# Patient Record
Sex: Female | Born: 1975 | Race: Black or African American | Hispanic: No | Marital: Married | Smoking: Never smoker
Health system: Southern US, Community
[De-identification: ages and names within clinical notes are randomized; demographics above are authoritative.]

## PROBLEM LIST (undated history)

## (undated) DIAGNOSIS — I10 Essential (primary) hypertension: Secondary | ICD-10-CM

## (undated) HISTORY — PX: OOPHORECTOMY: SHX86

## (undated) HISTORY — PX: KNEE SURGERY: SHX244

---

## 2005-05-02 ENCOUNTER — Ambulatory Visit (HOSPITAL_COMMUNITY): Admission: RE | Admit: 2005-05-02 | Discharge: 2005-05-02 | Payer: Self-pay | Admitting: Gynecology

## 2005-10-08 IMAGING — RF DG HYSTEROGRAM
5 series · 5 of 5 positions shown · IV contrast (omnipaque)
Comparison: none

CLINICAL DATA: Secondary infertility.  The patient states she has had one tube and ovary removed for torsed ovary.   
HYSTEROGRAM:
Following sterile cleansing of the external cervical os with Betadine, hysterosalpingography was performed via placement of a 5 french hysterosalpingography catheter into the endocervical canal where it was stabilized with an end catheter balloon.   Approximately 10 cc of Omnipaque 300% was injected into the endometrial canal without difficulty.   A normal endometrial morphology is noted.  The right fallopian tube filled to the mid isthmic region where there was an abrupt termination and mild distention of the tube compatible with post surgical change.   The left fallopian tube did not demonstrate any filling.  For this reason, the catheter was advanced to the region of the left cornua and reinjection was undertaken.   Filling of the cornual tip itself is noted with some punctuate contrast collections identified directly following the filling of the cornua.   No tubal filling is seen beyond this and findings are compatible with occlusion proximally on this side.   The punctate collections suggest that there may be underlying salpingitis isthmica nodosa.   Alternatively this may represent filling of very small vascular channels with contrast due to the selective injection.

[Series 1: run · 1 of 1 slices shown (1 of 5)]
[im 1/1]
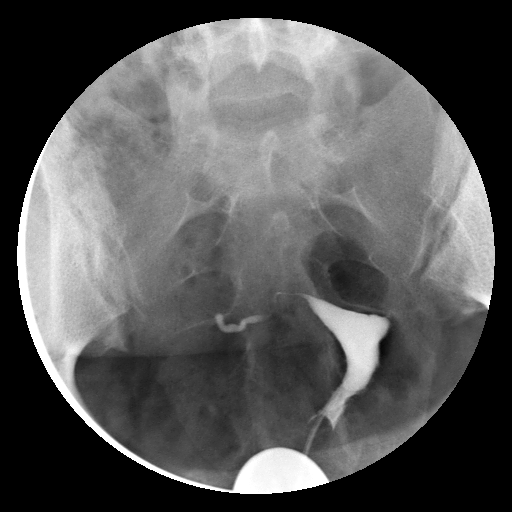

[Series 2: run · 1 of 1 slices shown (2 of 5)]
[im 1/1]
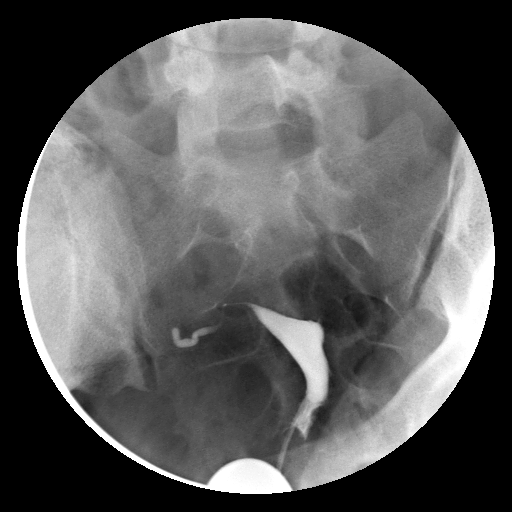

[Series 3: run · 1 of 1 slices shown (3 of 5)]
[im 1/1]
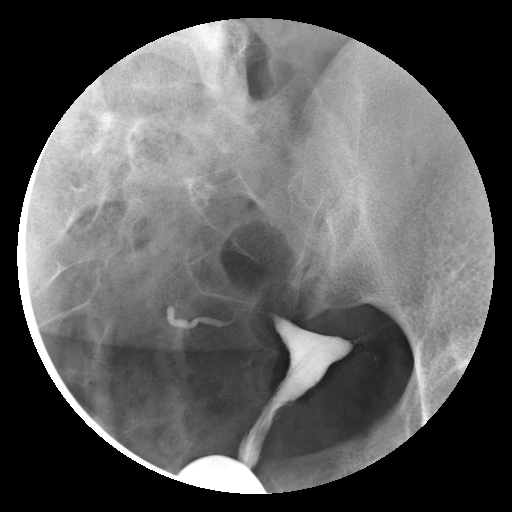

[Series 4: run · 1 of 1 slices shown (4 of 5)]
[im 1/1]
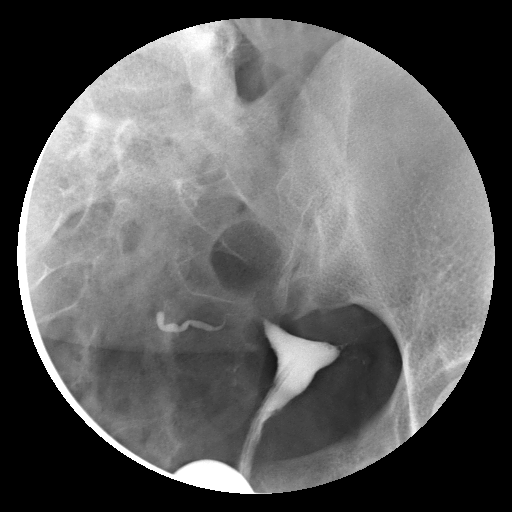

[Series 5: run · 1 of 1 slices shown (5 of 5)]
[im 1/1]
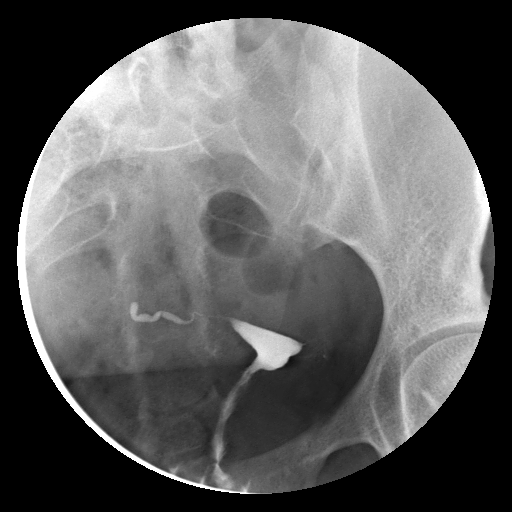

[5 of 5 positions shown; findings below may reference images not displayed]

IMPRESSION: 1.  Normal endometrial morphology.
2.  Post surgical occlusion of the mid isthmic portion of the right fallopian tube.
3.  Occlusion of the proximal isthmic portion of the left fallopian tube with questionable changes of salpingitis isthmica nodosa on this side.   No signs of any free intraperitoneal spill is noted.  Today?s scanned findings were discussed with the patient and her husband.

## 2007-01-16 ENCOUNTER — Emergency Department (HOSPITAL_COMMUNITY): Admission: EM | Admit: 2007-01-16 | Discharge: 2007-01-17 | Payer: Self-pay | Admitting: Emergency Medicine

## 2007-04-03 ENCOUNTER — Emergency Department (HOSPITAL_COMMUNITY): Admission: EM | Admit: 2007-04-03 | Discharge: 2007-04-03 | Payer: Self-pay | Admitting: Emergency Medicine

## 2012-02-16 ENCOUNTER — Emergency Department (HOSPITAL_COMMUNITY)
Admission: EM | Admit: 2012-02-16 | Discharge: 2012-02-16 | Disposition: A | Discharge status: Home / Self Care | Payer: No Typology Code available for payment source | Attending: Emergency Medicine | Admitting: Emergency Medicine

## 2012-02-16 ENCOUNTER — Encounter (HOSPITAL_COMMUNITY): Payer: Self-pay | Admitting: *Deleted

## 2012-02-16 DIAGNOSIS — M545 Low back pain, unspecified: Secondary | ICD-10-CM | POA: Insufficient documentation

## 2012-02-16 DIAGNOSIS — M542 Cervicalgia: Secondary | ICD-10-CM

## 2012-02-16 MED ORDER — IBUPROFEN 800 MG PO TABS: 800.0000 mg | ORAL_TABLET | Freq: Three times a day (TID) | ORAL | Status: AC

## 2012-02-16 MED ORDER — IBUPROFEN 800 MG PO TABS
800.0000 mg | ORAL_TABLET | Freq: Once | ORAL | Status: AC
  Administered 2012-02-16: 800 mg via ORAL
  Filled 2012-02-16: qty 1

## 2012-02-16 MED ORDER — METHOCARBAMOL 500 MG PO TABS: 500.0000 mg | ORAL_TABLET | Freq: Four times a day (QID) | ORAL | Status: AC | PRN
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
## 2012-02-16 NOTE — ED Notes (Signed)
Pt in hallway bed. Pt trying to take off neck braces and unstrap back board. Pt informed of risks of this action and will not follow directions to leave brace intact.
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
## 2012-02-17 NOTE — ED Provider Notes (Signed)
Medical screening examination/treatment/procedure(s) were performed by non-physician practitioner and as supervising physician I was immediately available for consultation/collaboration.   Suzi Roots, MD 02/17/12 [phone]

## 2012-11-17 ENCOUNTER — Encounter (HOSPITAL_COMMUNITY): Payer: Self-pay | Admitting: Emergency Medicine

## 2012-11-17 ENCOUNTER — Emergency Department (INDEPENDENT_AMBULATORY_CARE_PROVIDER_SITE_OTHER)
Admission: EM | Admit: 2012-11-17 | Discharge: 2012-11-17 | Disposition: A | Discharge status: Home / Self Care | Payer: 59 | Source: Home / Self Care

## 2012-11-17 DIAGNOSIS — J040 Acute laryngitis: Secondary | ICD-10-CM

## 2012-11-17 DIAGNOSIS — J029 Acute pharyngitis, unspecified: Secondary | ICD-10-CM

## 2012-11-17 DIAGNOSIS — J069 Acute upper respiratory infection, unspecified: Secondary | ICD-10-CM

## 2012-11-17 NOTE — ED Provider Notes (Signed)
History     CSN: [id number]  Arrival date & time 11/17/12  1004   None     Chief Complaint  Patient presents with  . Sore Throat    (Consider location/radiation/quality/duration/timing/severity/associated sxs/prior treatment) HPI Comments: 36 year old female presents with a sore throat for approximately 2 days. This is preceded by upper respiratory congestion with minor cough and PND. She feels as though much of her congestion is in her throat and uppermost chest. She denies having fever, earache or shortness of breath or chest pain. She noted that she saw a dentist last week for toothache and possible infection and was given penicillin. This is approximately 5 days ago she has been taking it but not 3 times every day.   History reviewed. No pertinent past medical history.  Past Surgical History  Procedure Date  . Knee surgery     No family history on file.  History  Substance Use Topics  . Smoking status: Never Smoker   . Smokeless tobacco: Not on file  . Alcohol Use: No    OB History    Grav Para Term Preterm Abortions TAB SAB Ect Mult Living                  Review of Systems  Constitutional: Negative for fever, chills, activity change, appetite change and fatigue.  HENT: Positive for congestion, sore throat, rhinorrhea and postnasal drip. Negative for facial swelling, neck pain and neck stiffness.   Eyes: Negative.   Respiratory: Negative.   Cardiovascular: Negative.   Gastrointestinal: Negative.   Skin: Negative for pallor and rash.  Neurological: Negative.     Allergies  Review of patient's allergies indicates no known allergies.  Home Medications   Current Outpatient Rx  Name  Route  Sig  Dispense  Refill  . HYDROCODONE-ACETAMINOPHEN 5-325 MG PO TABS   Oral   Take 1 tablet by mouth every 6 (six) hours as needed.         Marland Kitchen PENICILLIN V POTASSIUM PO   Oral   Take by mouth.           BP 152/98  Pulse 70  Temp 97.6 F (36.4 C) (Oral)   Resp 20  SpO2 99%  LMP 10/18/2012  Physical Exam  Constitutional: She is oriented to person, place, and time. She appears well-developed and well-nourished. No distress.  HENT:       Bilateral TMs are normal. Oropharynx with minor erythema or consistent with irritation and infection. There is clear PND. No exudates  Neck: Normal range of motion. Neck supple.  Cardiovascular: Normal rate and regular rhythm.   Pulmonary/Chest: Effort normal and breath sounds normal. No respiratory distress. She has no wheezes. She has no rales.  Musculoskeletal: Normal range of motion. She exhibits no edema.  Lymphadenopathy:    She has no cervical adenopathy.  Neurological: She is alert and oriented to person, place, and time.  Skin: Skin is warm and dry. No rash noted.  Psychiatric: She has a normal mood and affect.    ED Course  Procedures (including critical care time)  Labs Reviewed - No data to display No results found.   1. URI (upper respiratory infection)   2. Pharyngitis   3. Laryngitis       MDM  This is a stable-appearing female with primarily upper respiratory congestion and minor sore throat. Treat her with saltwater gargles. Recommend Cepacol lozenges when necessary sore throat discomfort Ibuprofen 600 mg every 6-8 hours when necessary throat  pain. Recommend either Claritin or Allegra 60 mg twice a day 180 mg daily for drainage as this is the primary source for the sore throat. Drink plenty of fluids and stay well hydrated, voice rest as much as possible Continue on her antibiotics prescribed by the dentist last week.         Hayden Rasmussen, NP 11/17/12 1114

## 2012-11-17 NOTE — ED Notes (Signed)
C/o sore throat and dizziness.  Reports being treated for tooth pain on Wednesday 12/11 at [facility].  Treated with pcn, continues to take penicillin.  Reports may have not been taking correctly or able to take as directed (not getting full dose in each day).  Sore throat started 2 days ago.  Reports chest congestion.  Patient is currently performing [place] [employer], hectic schedule.

## 2012-11-17 NOTE — ED Provider Notes (Signed)
Medical screening examination/treatment/procedure(s) were performed by non-physician practitioner and as supervising physician I was immediately available for consultation/collaboration.  Leslee Home, M.D.   Reuben Likes, MD 11/17/12 1351

## 2014-02-10 ENCOUNTER — Encounter (HOSPITAL_BASED_OUTPATIENT_CLINIC_OR_DEPARTMENT_OTHER): Payer: Self-pay | Admitting: Emergency Medicine

## 2014-02-10 ENCOUNTER — Emergency Department (HOSPITAL_BASED_OUTPATIENT_CLINIC_OR_DEPARTMENT_OTHER)
Admission: EM | Admit: 2014-02-10 | Discharge: 2014-02-10 | Disposition: A | Discharge status: Home / Self Care | Payer: Managed Care, Other (non HMO) | Attending: Emergency Medicine | Admitting: Emergency Medicine

## 2014-02-10 ENCOUNTER — Emergency Department (HOSPITAL_BASED_OUTPATIENT_CLINIC_OR_DEPARTMENT_OTHER): Payer: Managed Care, Other (non HMO)

## 2014-02-10 DIAGNOSIS — I10 Essential (primary) hypertension: Secondary | ICD-10-CM

## 2014-02-10 DIAGNOSIS — Z79899 Other long term (current) drug therapy: Secondary | ICD-10-CM | POA: Insufficient documentation

## 2014-02-10 DIAGNOSIS — R51 Headache: Secondary | ICD-10-CM | POA: Insufficient documentation

## 2014-02-10 DIAGNOSIS — K219 Gastro-esophageal reflux disease without esophagitis: Secondary | ICD-10-CM | POA: Insufficient documentation

## 2014-02-10 HISTORY — DX: Essential (primary) hypertension: I10

## 2014-02-10 LAB — COMPREHENSIVE METABOLIC PANEL
ALBUMIN: 3.5 g/dL (ref 3.5–5.2)
ALT: 14 U/L (ref 0–35)
AST: 25 U/L (ref 0–37)
Alkaline Phosphatase: 73 U/L (ref 39–117)
BUN: 12 mg/dL (ref 6–23)
CALCIUM: 8.9 mg/dL (ref 8.4–10.5)
CHLORIDE: 99 meq/L (ref 96–112)
CO2: 24 mEq/L (ref 19–32)
CREATININE: 0.67 mg/dL (ref 0.50–1.10)
GFR calc Af Amer: >90 mL/min (ref >90)
GFR calc non Af Amer: >90 mL/min (ref >90)
GLUCOSE: 100 mg/dL — AB (ref 70–99)
POTASSIUM: 4.1 meq/L (ref 3.7–5.3)
Sodium: 137 mEq/L (ref 137–147)
TOTAL PROTEIN: 7 g/dL (ref 6.0–8.3)
Total Bilirubin: 0.3 mg/dL (ref 0.3–1.2)

## 2014-02-10 LAB — I-STAT CHEM 8, ED
BUN: 11 mg/dL (ref 6–23)
CALCIUM ION: 1.25 mmol/L — AB (ref 1.12–1.23)
CHLORIDE: 99 meq/L (ref 96–112)
Creatinine, Ser: 0.8 mg/dL (ref 0.50–1.10)
Glucose, Bld: 101 mg/dL — ABNORMAL HIGH (ref 70–99)
HCT: 37 % (ref 36.0–46.0)
Hemoglobin: 12.6 g/dL (ref 12.0–15.0)
POTASSIUM: 3.7 meq/L (ref 3.7–5.3)
SODIUM: 139 meq/L (ref 137–147)
TCO2: 26 mmol/L (ref 0–100)

## 2014-02-10 LAB — CBC WITH DIFFERENTIAL/PLATELET
BASOS ABS: 0 10*3/uL (ref 0.0–0.1)
BASOS PCT: 0 % (ref 0–1)
EOS PCT: 1 % (ref 0–5)
Eosinophils Absolute: 0.1 10*3/uL (ref 0.0–0.7)
HCT: 33.9 % — ABNORMAL LOW (ref 36.0–46.0)
HEMOGLOBIN: 11.6 g/dL — AB (ref 12.0–15.0)
LYMPHS PCT: 33 % (ref 12–46)
Lymphs Abs: 2.2 10*3/uL (ref 0.7–4.0)
MCH: 29.7 pg (ref 26.0–34.0)
MCHC: 34.2 g/dL (ref 30.0–36.0)
MCV: 86.7 fL (ref 78.0–100.0)
Monocytes Absolute: 0.5 10*3/uL (ref 0.1–1.0)
Monocytes Relative: 8 % (ref 3–12)
Neutro Abs: 3.7 10*3/uL (ref 1.7–7.7)
Neutrophils Relative %: 58 % (ref 43–77)
PLATELETS: 260 10*3/uL (ref 150–400)
RBC: 3.91 MIL/uL (ref 3.87–5.11)
RDW: 12.5 % (ref 11.5–15.5)
WBC: 6.5 10*3/uL (ref 4.0–10.5)

## 2014-02-10 LAB — TROPONIN I

## 2014-02-10 LAB — LIPASE, BLOOD: Lipase: 11 U/L (ref 11–59)

## 2014-02-10 MED ORDER — RABEPRAZOLE SODIUM 20 MG PO TBEC: 20.0000 mg | DELAYED_RELEASE_TABLET | Freq: Every day | ORAL | Status: AC

## 2014-02-10 MED ORDER — ACETAMINOPHEN 325 MG PO TABS
650.0000 mg | ORAL_TABLET | Freq: Once | ORAL | Status: AC
  Administered 2014-02-10: 650 mg via ORAL
  Filled 2014-02-10: qty 2

## 2014-02-10 MED ORDER — GI COCKTAIL ~~LOC~~
30.0000 mL | Freq: Once | ORAL | Status: AC
Start: 2014-02-10 — End: 2014-02-10
  Administered 2014-02-10: 30 mL via ORAL
  Filled 2014-02-10: qty 30

## 2014-02-10 MED ORDER — HYDROCODONE-ACETAMINOPHEN 5-325 MG PO TABS: 1.0000 | ORAL_TABLET | ORAL | Status: AC | PRN

## 2014-02-10 MED ORDER — LISINOPRIL 5 MG PO TABS: 5.0000 mg | ORAL_TABLET | Freq: Every day | ORAL | Status: AC

## 2014-02-10 NOTE — ED Notes (Signed)
Pt reports she "felt bad" when she woke up at 0500- Reports she had headache, slight nausea and fatigue. Also reports "indigestion"

## 2014-02-10 NOTE — Discharge Instructions (Signed)
Gastroesophageal Reflux Disease, Adult  Gastroesophageal reflux disease (GERD) happens when acid from your stomach flows up into the esophagus. When acid comes in contact with the esophagus, the acid causes soreness (inflammation) in the esophagus. Over time, GERD may create small holes (ulcers) in the lining of the esophagus.  CAUSES   · Increased body weight. This puts pressure on the stomach, making acid rise from the stomach into the esophagus.  · Smoking. This increases acid production in the stomach.  · Drinking alcohol. This causes decreased pressure in the lower esophageal sphincter (valve or ring of muscle between the esophagus and stomach), allowing acid from the stomach into the esophagus.  · Late evening meals and a full stomach. This increases pressure and acid production in the stomach.  · A malformed lower esophageal sphincter.  Sometimes, no cause is found.  SYMPTOMS   · Burning pain in the lower part of the mid-chest behind the breastbone and in the mid-stomach area. This may occur twice a week or more often.  · Trouble swallowing.  · Sore throat.  · Dry cough.  · Asthma-like symptoms including chest tightness, shortness of breath, or wheezing.  DIAGNOSIS   Your caregiver may be able to diagnose GERD based on your symptoms. In some cases, X-rays and other tests may be done to check for complications or to check the condition of your stomach and esophagus.  TREATMENT   Your caregiver may recommend over-the-counter or prescription medicines to help decrease acid production. Ask your caregiver before starting or adding any new medicines.   HOME CARE INSTRUCTIONS   · Change the factors that you can control. Ask your caregiver for guidance concerning weight loss, quitting smoking, and alcohol consumption.  · Avoid foods and drinks that make your symptoms worse, such as:  · Caffeine or alcoholic drinks.  · Chocolate.  · Peppermint or mint flavorings.  · Garlic and onions.  · Spicy foods.  · Citrus fruits,  such as oranges, lemons, or limes.  · Tomato-based foods such as sauce, chili, salsa, and pizza.  · Fried and fatty foods.  · Avoid lying down for the 3 hours prior to your bedtime or prior to taking a nap.  · Eat small, frequent meals instead of large meals.  · Wear loose-fitting clothing. Do not wear anything tight around your waist that causes pressure on your stomach.  · Raise the head of your bed 6 to 8 inches with wood blocks to help you sleep. Extra pillows will not help.  · Only take over-the-counter or prescription medicines for pain, discomfort, or fever as directed by your caregiver.  · Do not take aspirin, ibuprofen, or other nonsteroidal anti-inflammatory drugs (NSAIDs).  SEEK IMMEDIATE MEDICAL CARE IF:   · You have pain in your arms, neck, jaw, teeth, or back.  · Your pain increases or changes in intensity or duration.  · You develop nausea, vomiting, or sweating (diaphoresis).  · You develop shortness of breath, or you faint.  · Your vomit is green, yellow, black, or looks like coffee grounds or blood.  · Your stool is red, bloody, or black.  These symptoms could be signs of other problems, such as heart disease, gastric bleeding, or esophageal bleeding.  MAKE SURE YOU:   · Understand these instructions.  · Will watch your condition.  · Will get help right away if you are not doing well or get worse.  Document Released: 08/29/2005 Document Revised: 02/11/2012 Document Reviewed: 06/08/2011  Exit[employer]® Patient   Information ©2014 [employer].

## 2014-02-10 NOTE — ED Provider Notes (Signed)
CSN: [id number]     Arrival date & time 02/10/14  1303 History   First MD Initiated Contact with Patient 02/10/14 1314     Chief Complaint  Patient presents with  . Hypertension     (Consider location/radiation/quality/duration/timing/severity/associated sxs/prior Treatment) HPI Comments: Pt states that she has a history of htn and she hasn't taken any medication for her htn in the last couple of days. Pt states that she woke up with a headache this morning and she feels sluggish. Pt states that she has also had burning in her chest. Pt states that the "indigestion" is often associated with the headaches. Pt states that she doesn't have a pcp. No swelling, blurred vision or sob. Pt states that her bp at work was 160/120  The history is provided by the patient. No language interpreter was used.    Past Medical History  Diagnosis Date  . Hypertension    Past Surgical History  Procedure Laterality Date  . Knee surgery    . Oophorectomy     No family history on file. History  Substance Use Topics  . Smoking status: Never Smoker   . Smokeless tobacco: Never Used  . Alcohol Use: No   OB History   Grav Para Term Preterm Abortions TAB SAB Ect Mult Living                 Review of Systems  Constitutional: Negative.   Respiratory: Negative.   Cardiovascular: Negative.       Allergies  Review of patient's allergies indicates no known allergies.  Home Medications   Current Outpatient Rx  Name  Route  Sig  Dispense  Refill  . lisinopril (PRINIVIL,ZESTRIL) 5 MG tablet   Oral   Take 5 mg by mouth daily.         Marland Kitchen HYDROcodone-acetaminophen (NORCO/VICODIN) 5-325 MG per tablet   Oral   Take 1 tablet by mouth every 6 (six) hours as needed.         Marland Kitchen PENICILLIN V POTASSIUM PO   Oral   Take by mouth.          BP 153/92  Pulse 75  Temp(Src) 98.9 F (37.2 C) (Oral)  Resp 18  Ht 5\' 4"  (1.626 m)  Wt 275 lb (124.739 kg)  BMI 47.18 kg/m2  SpO2 100%  LMP  01/27/2014 Physical Exam  Nursing note and vitals reviewed. Constitutional: She is oriented to person, place, and time. She appears well-developed and well-nourished.  HENT:  Head: Normocephalic and atraumatic.  Cardiovascular: Normal rate and regular rhythm.   Pulmonary/Chest: Effort normal and breath sounds normal.  Abdominal: Soft. Bowel sounds are normal.  Musculoskeletal: Normal range of motion.  Neurological: She is alert and oriented to person, place, and time.  Skin: Skin is warm and dry.    ED Course  Procedures (including critical care time) Labs Review Labs Reviewed  CBC WITH DIFFERENTIAL - Abnormal; Notable for the following:    Hemoglobin 11.6 (*)    HCT 33.9 (*)    All other components within normal limits  COMPREHENSIVE METABOLIC PANEL - Abnormal; Notable for the following:    Glucose, Bld 100 (*)    All other components within normal limits  I-STAT CHEM 8, ED - Abnormal; Notable for the following:    Glucose, Bld 101 (*)    Calcium, Ion 1.25 (*)    All other components within normal limits  LIPASE, BLOOD  TROPONIN I   Imaging Review Dg Chest 2  View  02/10/2014   CLINICAL DATA Hypertension  EXAM CHEST  2 VIEW  COMPARISON None.  FINDINGS Lungs are clear. Heart size and pulmonary vascularity are normal. No adenopathy. No bone lesions.  IMPRESSION No abnormality noted.  SIGNATURE  Electronically Signed   By: Bretta BangWilliam  Woodruff M.D.   On: 02/10/2014 14:14     EKG Interpretation   Date/Time:  Wednesday February 10 2014 13:24:11 EDT Ventricular Rate:  68 PR Interval:  164 QRS Duration: 86 QT Interval:  430 QTC Calculation: 457 R Axis:   47 Text Interpretation:  Normal sinus rhythm Normal ECG Confirmed by WARD,   DO, KRISTEN ([id number]) on 02/10/2014 1:35:11 PM      MDM   Final diagnoses:  HTN (hypertension)  GERD (gastroesophageal reflux disease)    Doubt acs. More likely gerd.symptoms have resolved with gi cocktail and tylenol. Pt instructed on importance  of continually taking bp medication. Pt not from here but all stressed the importance of finding a pcp    Teressa LowerVrinda Cuinn Westerhold, NP 02/10/14 1649

## 2014-02-11 NOTE — ED Provider Notes (Signed)
Medical screening examination/treatment/procedure(s) were performed by non-physician practitioner and as supervising physician I was immediately available for consultation/collaboration.   EKG Interpretation   Date/Time:  Wednesday February 10 2014 13:24:11 EDT Ventricular Rate:  68 PR Interval:  164 QRS Duration: 86 QT Interval:  430 QTC Calculation: 457 R Axis:   47 Text Interpretation:  Normal sinus rhythm Normal ECG Confirmed by Kaeleigh Westendorf,   DO, Kyrian Stage ([id number]) on 02/10/2014 1:35:11 PM        Layla MawKristen N Lionel Woodberry, DO 02/11/14 [id number]

## 2014-07-19 IMAGING — CR DG CHEST 2V
2 series · 2 of 2 positions shown · non-contrast
Comparison: none

[w chest pa]
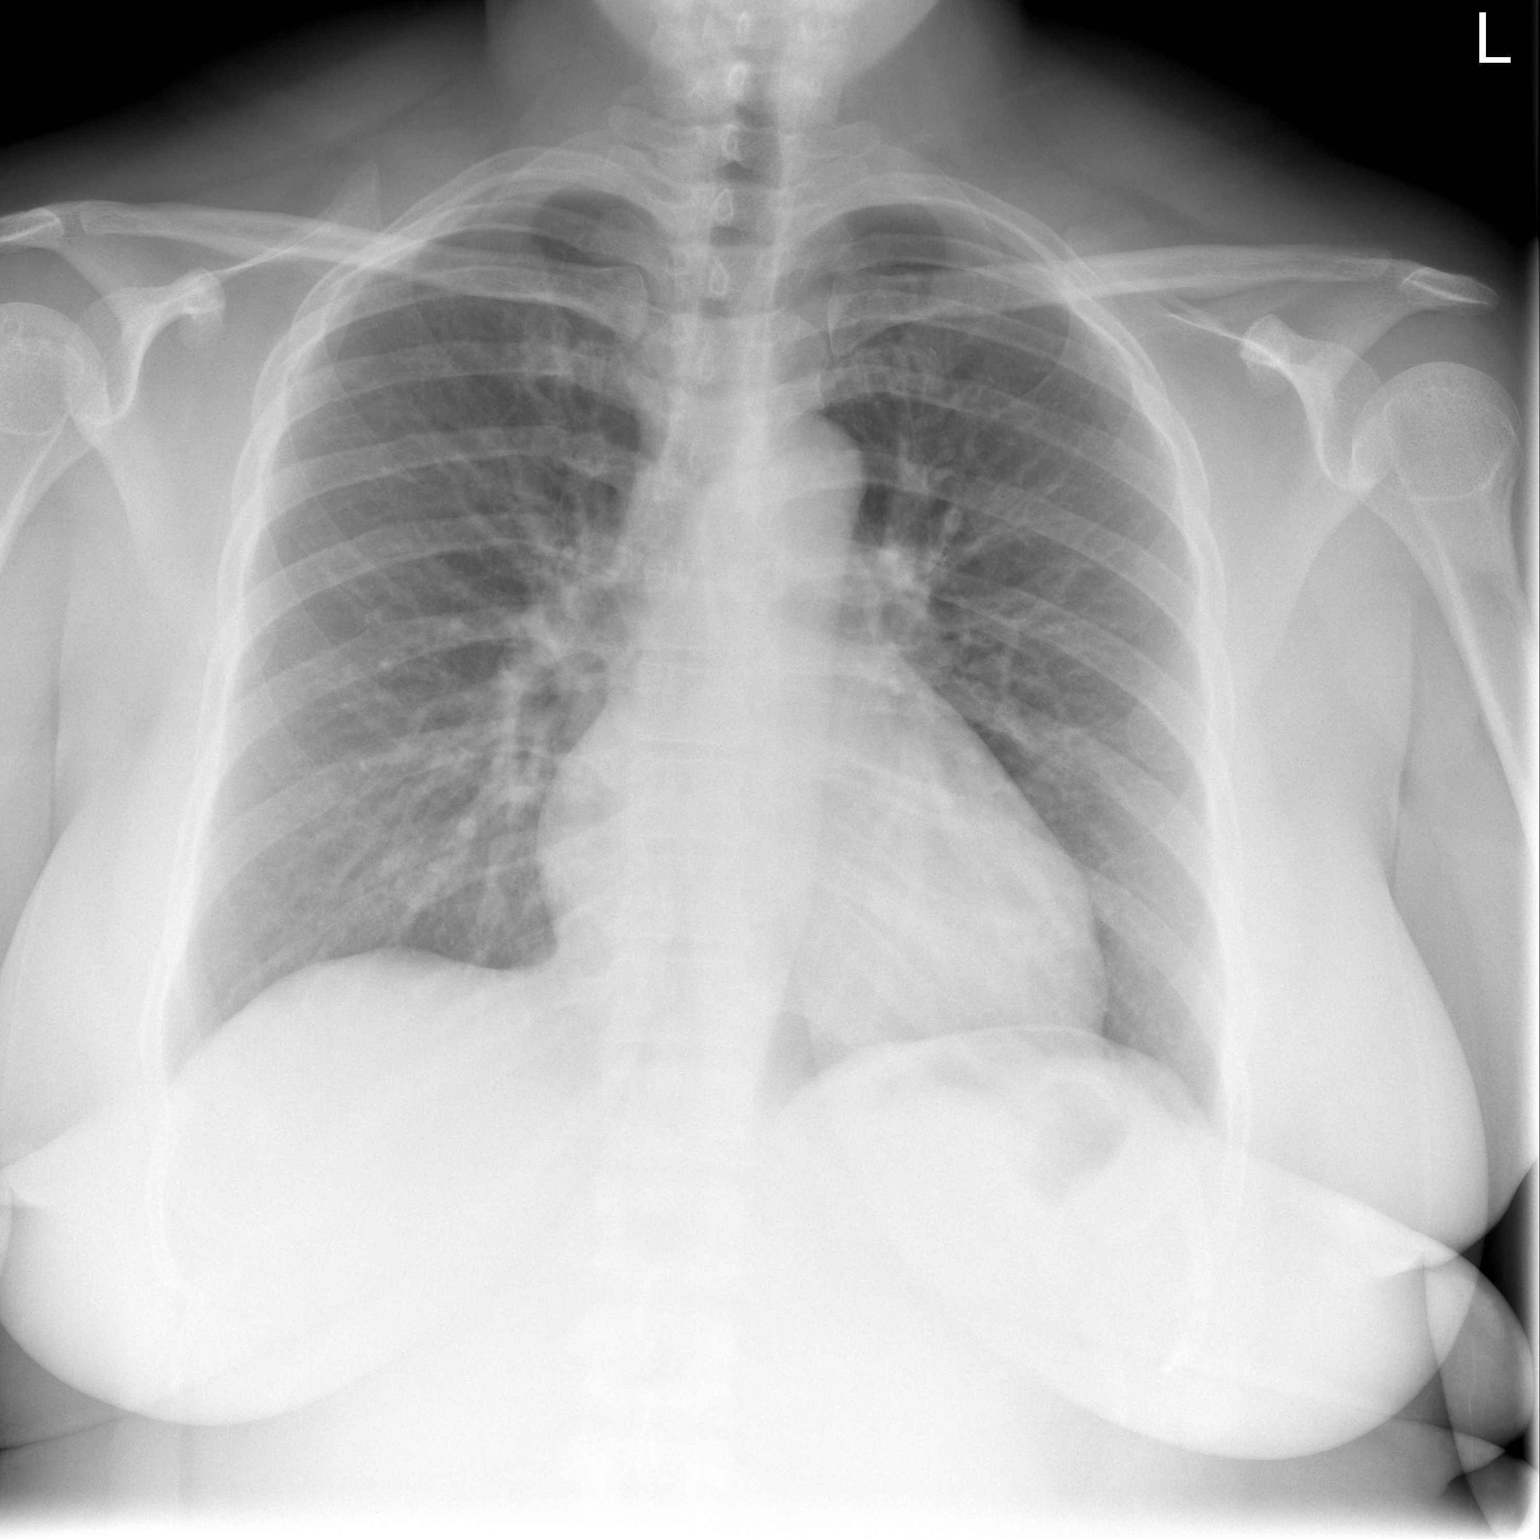

[w chest lat]
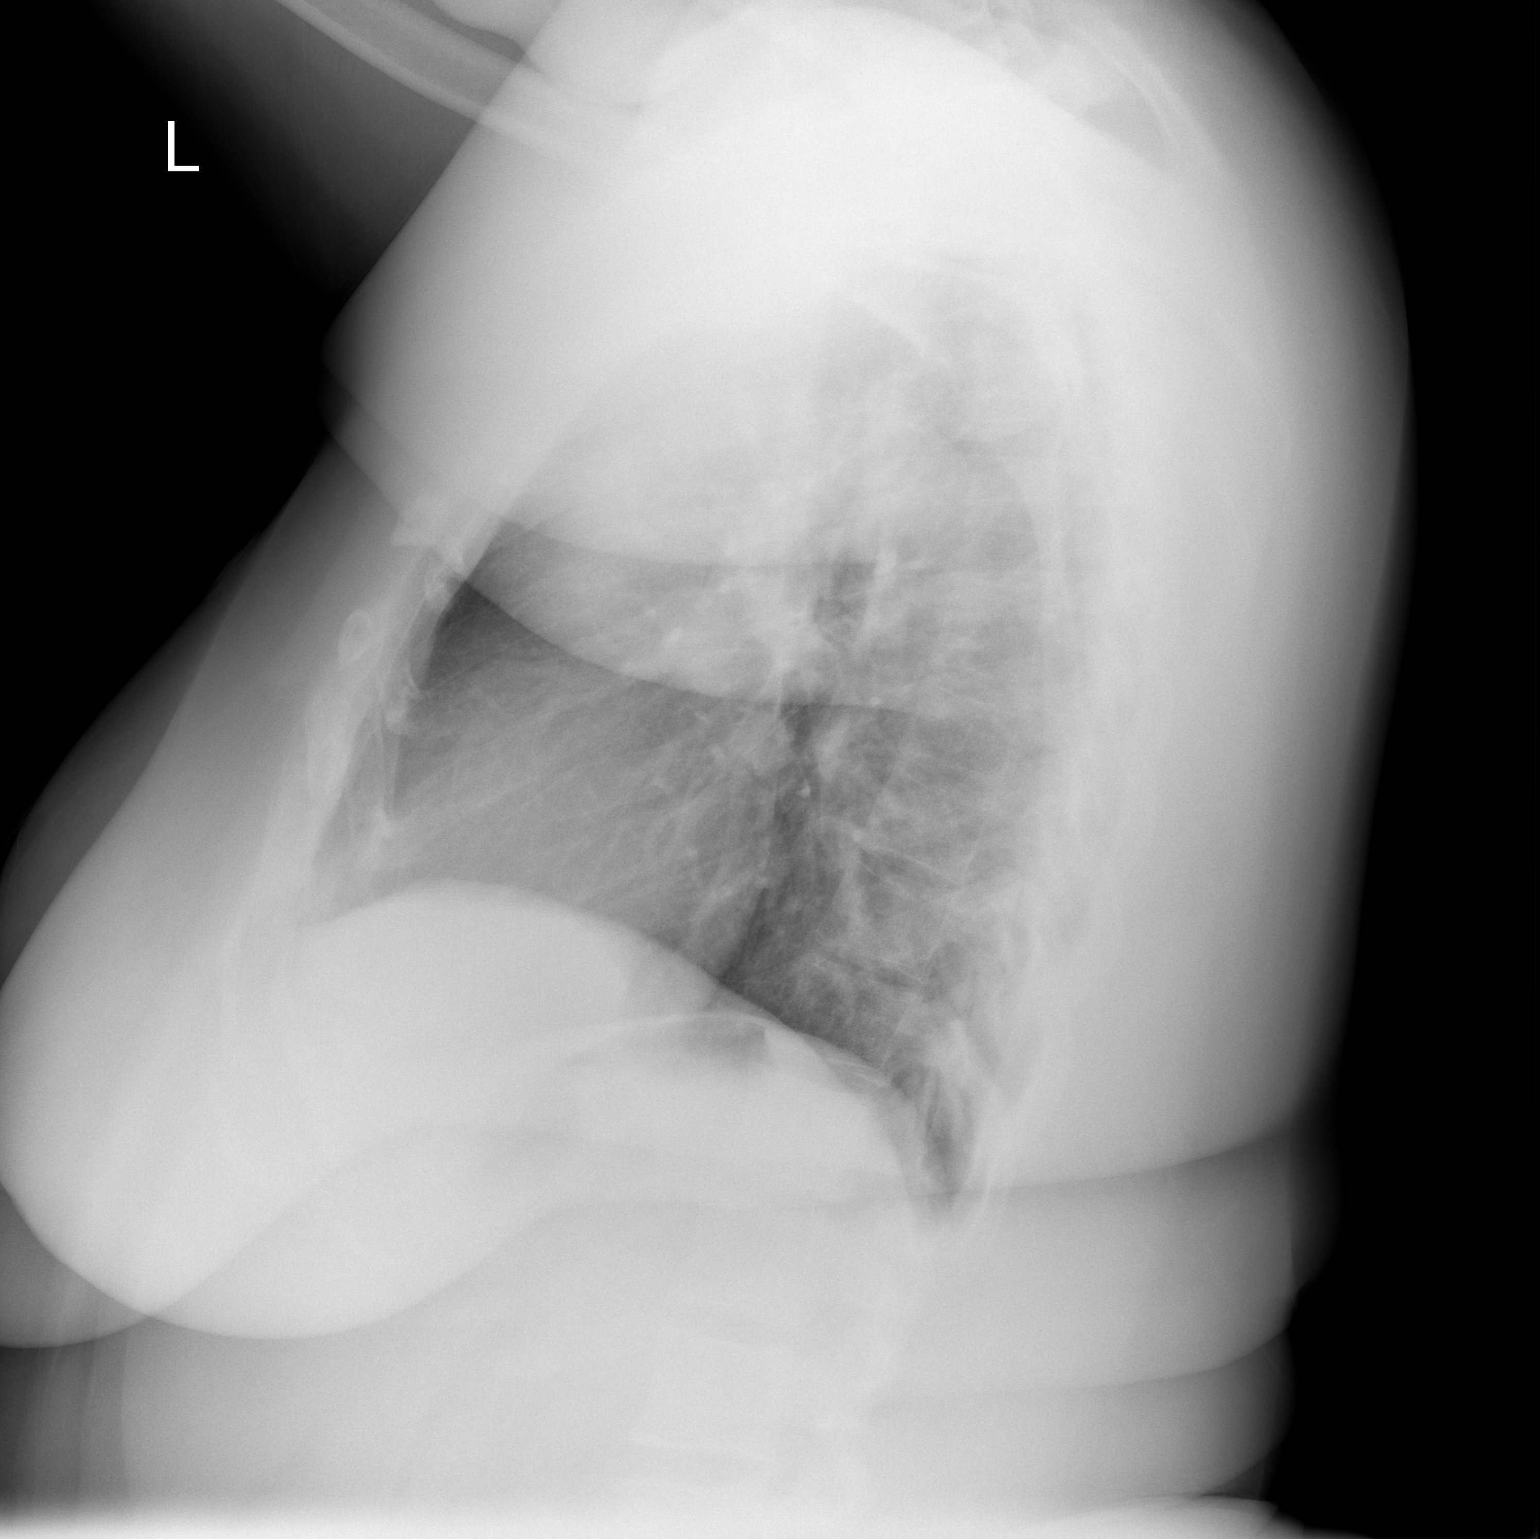

[2 of 2 positions shown; findings below may reference images not displayed]

CLINICAL DATA
Hypertension

EXAM
CHEST  2 VIEW

COMPARISON
None.

FINDINGS
Lungs are clear. Heart size and pulmonary vascularity are normal. No
adenopathy. No bone lesions.

IMPRESSION
No abnormality noted.

SIGNATURE
